# Patient Record
Sex: Male | Born: 1970 | Race: Black or African American | Hispanic: No | Marital: Single | Smoking: Former smoker
Health system: Southern US, Community
[De-identification: ages and names within clinical notes are randomized; demographics above are authoritative.]

## PROBLEM LIST (undated history)

## (undated) DIAGNOSIS — E119 Type 2 diabetes mellitus without complications: Secondary | ICD-10-CM

## (undated) DIAGNOSIS — I10 Essential (primary) hypertension: Secondary | ICD-10-CM

---

## 2009-08-14 ENCOUNTER — Emergency Department (HOSPITAL_BASED_OUTPATIENT_CLINIC_OR_DEPARTMENT_OTHER): Admission: EM | Admit: 2009-08-14 | Discharge: 2009-08-14 | Payer: Self-pay | Admitting: Emergency Medicine

## 2009-08-14 ENCOUNTER — Ambulatory Visit: Payer: Self-pay | Admitting: Diagnostic Radiology

## 2010-03-28 ENCOUNTER — Ambulatory Visit: Payer: Self-pay | Admitting: Diagnostic Radiology

## 2010-03-28 ENCOUNTER — Emergency Department (HOSPITAL_BASED_OUTPATIENT_CLINIC_OR_DEPARTMENT_OTHER): Admission: EM | Admit: 2010-03-28 | Discharge: 2010-03-28 | Payer: Self-pay | Admitting: Emergency Medicine

## 2010-09-07 ENCOUNTER — Emergency Department (HOSPITAL_BASED_OUTPATIENT_CLINIC_OR_DEPARTMENT_OTHER)
Admission: EM | Admit: 2010-09-07 | Discharge: 2010-09-07 | Payer: Self-pay | Source: Home / Self Care | Admitting: Emergency Medicine

## 2010-12-04 LAB — CBC
Hemoglobin: 15.1 g/dL (ref 13.0–17.0)
MCH: 23.7 pg — ABNORMAL LOW (ref 26.0–34.0)
RDW: 15.1 % (ref 11.5–15.5)

## 2010-12-04 LAB — COMPREHENSIVE METABOLIC PANEL
AST: 27 U/L (ref 0–37)
Albumin: 4.2 g/dL (ref 3.5–5.2)
Alkaline Phosphatase: 62 U/L (ref 39–117)
CO2: 27 mEq/L (ref 19–32)
Calcium: 9.2 mg/dL (ref 8.4–10.5)
Creatinine, Ser: 0.9 mg/dL (ref 0.4–1.5)
GFR calc Af Amer: >60 mL/min (ref >60)
Glucose, Bld: 110 mg/dL — ABNORMAL HIGH (ref 70–99)
Potassium: 3.7 mEq/L (ref 3.5–5.1)
Total Protein: 7.4 g/dL (ref 6.0–8.3)

## 2010-12-04 LAB — URINALYSIS, ROUTINE W REFLEX MICROSCOPIC
Bilirubin Urine: NEGATIVE
Ketones, ur: NEGATIVE mg/dL
Nitrite: NEGATIVE
Protein, ur: NEGATIVE mg/dL
Urobilinogen, UA: 1 mg/dL (ref 0.0–1.0)
pH: 6 (ref 5.0–8.0)

## 2010-12-04 LAB — DIFFERENTIAL
Basophils Relative: 2 % — ABNORMAL HIGH (ref 0–1)
Eosinophils Absolute: 0.2 10*3/uL (ref 0.0–0.7)
Lymphocytes Relative: 38 % (ref 12–46)
Lymphs Abs: 3.3 10*3/uL (ref 0.7–4.0)
Monocytes Relative: 7 % (ref 3–12)
Neutro Abs: 4.6 10*3/uL (ref 1.7–7.7)
Neutrophils Relative %: 51 % (ref 43–77)

## 2010-12-04 LAB — LIPASE, BLOOD: Lipase: 100 U/L (ref 23–300)

## 2010-12-21 LAB — COMPREHENSIVE METABOLIC PANEL
ALT: 39 U/L (ref 0–53)
AST: 38 U/L — ABNORMAL HIGH (ref 0–37)
Alkaline Phosphatase: 57 U/L (ref 39–117)
BUN: 15 mg/dL (ref 6–23)
Chloride: 104 mEq/L (ref 96–112)
Glucose, Bld: 76 mg/dL (ref 70–99)

## 2010-12-21 LAB — CBC
HCT: 48.7 % (ref 39.0–52.0)
Hemoglobin: 15.4 g/dL (ref 13.0–17.0)
RBC: 6.43 MIL/uL — ABNORMAL HIGH (ref 4.22–5.81)
RDW: 14.6 % (ref 11.5–15.5)
WBC: 10.1 10*3/uL (ref 4.0–10.5)

## 2010-12-21 LAB — URINALYSIS, ROUTINE W REFLEX MICROSCOPIC
Bilirubin Urine: NEGATIVE
Glucose, UA: NEGATIVE mg/dL
Hgb urine dipstick: NEGATIVE
Ketones, ur: NEGATIVE mg/dL
Nitrite: NEGATIVE
Protein, ur: NEGATIVE mg/dL
Specific Gravity, Urine: 1.021 (ref 1.005–1.030)
Urobilinogen, UA: 0.2 mg/dL (ref 0.0–1.0)
pH: 6.5 (ref 5.0–8.0)

## 2010-12-21 LAB — DIFFERENTIAL
Basophils Absolute: 0.3 10*3/uL — ABNORMAL HIGH (ref 0.0–0.1)
Basophils Relative: 3 % — ABNORMAL HIGH (ref 0–1)
Eosinophils Absolute: 0.1 10*3/uL (ref 0.0–0.7)
Eosinophils Relative: 1 % (ref 0–5)
Lymphocytes Relative: 35 % (ref 12–46)
Lymphs Abs: 3.5 10*3/uL (ref 0.7–4.0)
Monocytes Absolute: 0.6 10*3/uL (ref 0.1–1.0)
Monocytes Relative: 6 % (ref 3–12)
Neutro Abs: 5.6 10*3/uL (ref 1.7–7.7)
Neutrophils Relative %: 54 % (ref 43–77)

## 2011-03-02 ENCOUNTER — Emergency Department (HOSPITAL_BASED_OUTPATIENT_CLINIC_OR_DEPARTMENT_OTHER)
Admission: EM | Admit: 2011-03-02 | Discharge: 2011-03-02 | Disposition: A | Discharge status: Home / Self Care | Payer: Self-pay | Attending: Emergency Medicine | Admitting: Emergency Medicine

## 2011-03-02 ENCOUNTER — Emergency Department (INDEPENDENT_AMBULATORY_CARE_PROVIDER_SITE_OTHER): Payer: Self-pay

## 2011-03-02 DIAGNOSIS — M25539 Pain in unspecified wrist: Secondary | ICD-10-CM

## 2011-03-02 DIAGNOSIS — S60219A Contusion of unspecified wrist, initial encounter: Secondary | ICD-10-CM | POA: Insufficient documentation

## 2011-03-02 DIAGNOSIS — S7000XA Contusion of unspecified hip, initial encounter: Secondary | ICD-10-CM | POA: Insufficient documentation

## 2011-03-02 DIAGNOSIS — M25559 Pain in unspecified hip: Secondary | ICD-10-CM

## 2011-03-02 DIAGNOSIS — M542 Cervicalgia: Secondary | ICD-10-CM

## 2011-10-25 ENCOUNTER — Emergency Department (HOSPITAL_BASED_OUTPATIENT_CLINIC_OR_DEPARTMENT_OTHER)
Admission: EM | Admit: 2011-10-25 | Discharge: 2011-10-25 | Disposition: A | Discharge status: Home / Self Care | Payer: BC Managed Care – PPO | Attending: Emergency Medicine | Admitting: Emergency Medicine

## 2011-10-25 ENCOUNTER — Encounter (HOSPITAL_BASED_OUTPATIENT_CLINIC_OR_DEPARTMENT_OTHER): Payer: Self-pay | Admitting: Emergency Medicine

## 2011-10-25 DIAGNOSIS — S058X9A Other injuries of unspecified eye and orbit, initial encounter: Secondary | ICD-10-CM

## 2011-10-25 DIAGNOSIS — J329 Chronic sinusitis, unspecified: Secondary | ICD-10-CM

## 2011-10-25 DIAGNOSIS — Y9289 Other specified places as the place of occurrence of the external cause: Secondary | ICD-10-CM | POA: Insufficient documentation

## 2011-10-25 DIAGNOSIS — T1590XA Foreign body on external eye, part unspecified, unspecified eye, initial encounter: Secondary | ICD-10-CM | POA: Insufficient documentation

## 2011-10-25 DIAGNOSIS — F17200 Nicotine dependence, unspecified, uncomplicated: Secondary | ICD-10-CM | POA: Insufficient documentation

## 2011-10-25 MED ORDER — CIPROFLOXACIN HCL 0.3 % OP SOLN
2.0000 [drp] | OPHTHALMIC | Status: DC
  Administered 2011-10-25: 09:00:00 via OPHTHALMIC

## 2011-10-25 MED ORDER — TETANUS-DIPHTH-ACELL PERTUSSIS 5-2.5-18.5 LF-MCG/0.5 IM SUSP
0.5000 mL | Freq: Once | INTRAMUSCULAR | Status: AC
  Administered 2011-10-25: 0.5 mL via INTRAMUSCULAR
  Filled 2011-10-25: qty 0.5

## 2011-10-25 MED ORDER — AMOXICILLIN 500 MG PO CAPS: 500.0000 mg | ORAL_CAPSULE | Freq: Three times a day (TID) | ORAL | Status: AC

## 2011-10-25 MED ORDER — TETRACAINE HCL 0.5 % OP SOLN
1.0000 [drp] | Freq: Once | OPHTHALMIC | Status: AC
  Administered 2011-10-25: 1 [drp] via OPHTHALMIC
  Filled 2011-10-25: qty 2

## 2011-10-25 MED ORDER — FLUORESCEIN SODIUM 1 MG OP STRP
1.0000 | ORAL_STRIP | Freq: Once | OPHTHALMIC | Status: AC
  Administered 2011-10-25: 1 via OPHTHALMIC
  Filled 2011-10-25: qty 1

## 2011-10-25 MED ORDER — CIPROFLOXACIN HCL 0.3 % OP SOLN
OPHTHALMIC | Status: AC
  Filled 2011-10-25: qty 2.5

## 2011-10-25 NOTE — ED Provider Notes (Signed)
History     CSN: [id number]  Arrival date & time 10/25/11  9562   First MD Initiated Contact with Patient 10/25/11 [phone]      Chief Complaint  Patient presents with  . Eye Pain   patient noticed right eye redness after loading some boxes into a truck on Monday. He states it has been aching read with some watery drainage. He did take his contact lens out of the right eye, but left in his left eye. His vision is unchanged. He's also had a headache and some sinus drainage. He's had no fevers. No significant redness around the eye. He denies any vomiting. Denies any direct trauma. His tetanus is approximately 7 years out of date  (Consider location/radiation/quality/duration/timing/severity/associated sxs/prior treatment) HPI  History reviewed. No pertinent past medical history.  History reviewed. No pertinent past surgical history.  No family history on file.  History  Substance Use Topics  . Smoking status: Current Everyday Smoker  . Smokeless tobacco: Not on file  . Alcohol Use: Yes      Review of Systems  All other systems reviewed and are negative.    Allergies  Review of patient's allergies indicates no known allergies.  Home Medications  No current outpatient prescriptions on file.  BP 140/86  Pulse 65  Temp(Src) 97.9 F (36.6 C) (Oral)  Resp 18  SpO2 100%  Physical Exam  Nursing note and vitals reviewed. Constitutional: He is oriented to person, place, and time. He appears well-developed and well-nourished.  HENT:  Head: Normocephalic and atraumatic.  Nose: Nose normal.  Mouth/Throat: Oropharynx is clear and moist.       Nasal congestion, and right maxillary sinus tenderness  Eyes: Conjunctivae and EOM are normal. Pupils are equal, round, and reactive to light. Right eye exhibits no discharge. Left eye exhibits no discharge.  Neck: Neck supple. No JVD present.  Cardiovascular: Normal rate and regular rhythm.  Exam reveals no gallop and no friction rub.     No murmur heard. Pulmonary/Chest: Breath sounds normal. He has no wheezes. He has no rales. He exhibits no tenderness.  Abdominal: Soft. Bowel sounds are normal. He exhibits no distension. There is no tenderness. There is no rebound and no guarding.  Musculoskeletal: Normal range of motion.  Lymphadenopathy:    He has no cervical adenopathy.  Neurological: He is alert and oriented to person, place, and time. No cranial nerve deficit. Coordination normal.  Skin: Skin is warm and dry. No rash noted.  Psychiatric: He has a normal mood and affect.    ED Course  Procedures (including critical care time)  Labs Reviewed - No data to display No results found.   No diagnosis found.    MDM  Pt is seen and examined;  Initial history and physical completed.  Will follow.          Bernyce Brimley A. Patrica Duel, MD 10/26/11 1452

## 2011-10-25 NOTE — ED Notes (Signed)
Pt c/o right eye pain and redness. Pt reports he felt like he might have gotten dust in it on mon.

## 2012-09-13 ENCOUNTER — Encounter (HOSPITAL_BASED_OUTPATIENT_CLINIC_OR_DEPARTMENT_OTHER): Payer: Self-pay | Admitting: *Deleted

## 2012-09-13 ENCOUNTER — Emergency Department (HOSPITAL_BASED_OUTPATIENT_CLINIC_OR_DEPARTMENT_OTHER)
Admission: EM | Admit: 2012-09-13 | Discharge: 2012-09-13 | Disposition: A | Discharge status: Home / Self Care | Payer: BC Managed Care – PPO | Attending: Emergency Medicine | Admitting: Emergency Medicine

## 2012-09-13 DIAGNOSIS — T23239A Burn of second degree of unspecified multiple fingers (nail), not including thumb, initial encounter: Secondary | ICD-10-CM | POA: Insufficient documentation

## 2012-09-13 DIAGNOSIS — Y9389 Activity, other specified: Secondary | ICD-10-CM | POA: Insufficient documentation

## 2012-09-13 DIAGNOSIS — X131XXA Other contact with steam and other hot vapors, initial encounter: Secondary | ICD-10-CM | POA: Insufficient documentation

## 2012-09-13 DIAGNOSIS — X12XXXA Contact with other hot fluids, initial encounter: Secondary | ICD-10-CM | POA: Insufficient documentation

## 2012-09-13 DIAGNOSIS — T304 Corrosion of unspecified body region, unspecified degree: Secondary | ICD-10-CM

## 2012-09-13 DIAGNOSIS — Y92009 Unspecified place in unspecified non-institutional (private) residence as the place of occurrence of the external cause: Secondary | ICD-10-CM | POA: Insufficient documentation

## 2012-09-13 DIAGNOSIS — F17200 Nicotine dependence, unspecified, uncomplicated: Secondary | ICD-10-CM | POA: Insufficient documentation

## 2012-09-13 MED ORDER — IBUPROFEN 800 MG PO TABS
800.0000 mg | ORAL_TABLET | Freq: Once | ORAL | Status: AC
  Administered 2012-09-13: 800 mg via ORAL
  Filled 2012-09-13: qty 1
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
## 2012-09-13 NOTE — ED Notes (Signed)
[facility] for [facility]--waiting for doctor on call to come to telephone.
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
## 2013-09-07 ENCOUNTER — Emergency Department (HOSPITAL_BASED_OUTPATIENT_CLINIC_OR_DEPARTMENT_OTHER)
Admission: EM | Admit: 2013-09-07 | Discharge: 2013-09-07 | Disposition: A | Discharge status: Home / Self Care | Payer: 59 | Attending: Emergency Medicine | Admitting: Emergency Medicine

## 2013-09-07 ENCOUNTER — Encounter (HOSPITAL_BASED_OUTPATIENT_CLINIC_OR_DEPARTMENT_OTHER): Payer: Self-pay | Admitting: Emergency Medicine

## 2013-09-07 DIAGNOSIS — F17200 Nicotine dependence, unspecified, uncomplicated: Secondary | ICD-10-CM | POA: Insufficient documentation

## 2013-09-07 DIAGNOSIS — R220 Localized swelling, mass and lump, head: Secondary | ICD-10-CM | POA: Insufficient documentation

## 2013-09-07 DIAGNOSIS — L259 Unspecified contact dermatitis, unspecified cause: Secondary | ICD-10-CM

## 2013-09-07 MED ORDER — DEXAMETHASONE SODIUM PHOSPHATE 10 MG/ML IJ SOLN
10.0000 mg | Freq: Once | INTRAMUSCULAR | Status: AC
  Administered 2013-09-07: 10 mg via INTRAMUSCULAR
  Filled 2013-09-07: qty 1

## 2013-09-07 MED ORDER — PREDNISONE 10 MG PO TABS: 20.0000 mg | ORAL_TABLET | Freq: Every day | ORAL | Status: DC

## 2013-09-07 MED ORDER — HYDROXYZINE HCL 25 MG PO TABS: 25.0000 mg | ORAL_TABLET | Freq: Four times a day (QID) | ORAL | Status: DC

## 2013-09-07 NOTE — ED Provider Notes (Signed)
CSN: [id number]     Arrival date & time 09/07/13  0935 History   First MD Initiated Contact with Patient 09/07/13 [phone]     Chief Complaint  Patient presents with  . Rash   (Consider location/radiation/quality/duration/timing/severity/associated sxs/prior Treatment) HPI Comments: Patient presents with a two-day history of a rash. He states that 2 days ago used a hair dye to his beard. Shortly after that he started itching and developed bumps along his beard and his upper chest. He's had a history of an allergic reaction to hair dye in the past that was similar to this. He has a little swelling around his beard but denies any other facial swelling. There's no swelling to his tongue or lips. He denies any wheezing or shortness of breath. He's not been using any over-the-counter medications.  Patient is a 42 y.o. male presenting with rash.  Rash Associated symptoms: no abdominal pain, no diarrhea, no fatigue, no fever, no headaches, no joint pain, no nausea, no shortness of breath and not vomiting     History reviewed. No pertinent past medical history. History reviewed. No pertinent past surgical history. No family history on file. History  Substance Use Topics  . Smoking status: Current Every Day Smoker  . Smokeless tobacco: Not on file  . Alcohol Use: Yes    Review of Systems  Constitutional: Negative for fever, chills, diaphoresis and fatigue.  HENT: Positive for facial swelling. Negative for congestion, rhinorrhea and sneezing.   Eyes: Negative.   Respiratory: Negative for cough, chest tightness and shortness of breath.   Cardiovascular: Negative for chest pain and leg swelling.  Gastrointestinal: Negative for nausea, vomiting, abdominal pain, diarrhea and blood in stool.  Genitourinary: Negative for frequency, hematuria, flank pain and difficulty urinating.  Musculoskeletal: Negative for arthralgias and back pain.  Skin: Positive for rash.  Neurological: Negative for dizziness,  speech difficulty, weakness, numbness and headaches.    Allergies  Review of patient's allergies indicates no known allergies.  Home Medications   Current Outpatient Rx  Name  Route  Sig  Dispense  Refill  . hydrOXYzine (ATARAX/VISTARIL) 25 MG tablet   Oral   Take 1 tablet (25 mg total) by mouth every 6 (six) hours.   12 tablet   0   . predniSONE (DELTASONE) 10 MG tablet   Oral   Take 2 tablets (20 mg total) by mouth daily.   10 tablet   0    BP 149/99  Pulse 67  Temp(Src) 99 F (37.2 C) (Oral)  Resp 18  SpO2 98% Physical Exam  Constitutional: He is oriented to person, place, and time. He appears well-developed and well-nourished.  HENT:  Head: Normocephalic and atraumatic.  Eyes: Pupils are equal, round, and reactive to light.  Neck: Normal range of motion. Neck supple.  Cardiovascular: Normal rate, regular rhythm and normal heart sounds.   Pulmonary/Chest: Effort normal and breath sounds normal. No respiratory distress. He has no wheezes. He has no rales. He exhibits no tenderness.  Abdominal: Soft. Bowel sounds are normal. There is no tenderness. There is no rebound and no guarding.  Musculoskeletal: Normal range of motion. He exhibits no edema.  Lymphadenopathy:    He has no cervical adenopathy.  Neurological: He is alert and oriented to person, place, and time.  Skin: Skin is warm and dry. Rash noted.  Patient has some erythema and some mild swelling around his chin and cheeks to his beard area. He has small red papules to this area and  along his upper chest.  Psychiatric: He has a normal mood and affect.    ED Course  Procedures (including critical care time) Labs Review Labs Reviewed - No data to display Imaging Review No results found.  EKG Interpretation   None       MDM   1. Contact dermatitis    There is no evidence of cellulitis. He was given a shot of Decadron. He was given a prescription for prednisone. He was also given a prescription for  Atarax to use for itching. He was advised to return if his symptoms worsen or are not improving.    Rolan Bucco, MD 09/07/13 1012

## 2013-09-07 NOTE — ED Notes (Signed)
Patient here with rash to beard area and upper chest after using dye on beard 2 days ago, itching to same .

## 2021-05-27 ENCOUNTER — Other Ambulatory Visit: Payer: Self-pay

## 2021-05-27 ENCOUNTER — Encounter (HOSPITAL_BASED_OUTPATIENT_CLINIC_OR_DEPARTMENT_OTHER): Payer: Self-pay

## 2021-05-27 ENCOUNTER — Emergency Department (HOSPITAL_BASED_OUTPATIENT_CLINIC_OR_DEPARTMENT_OTHER)
Admission: EM | Admit: 2021-05-27 | Discharge: 2021-05-27 | Disposition: A | Discharge status: Home / Self Care | Payer: 59 | Attending: Emergency Medicine | Admitting: Emergency Medicine

## 2021-05-27 DIAGNOSIS — Z23 Encounter for immunization: Secondary | ICD-10-CM | POA: Insufficient documentation

## 2021-05-27 DIAGNOSIS — W268XXA Contact with other sharp object(s), not elsewhere classified, initial encounter: Secondary | ICD-10-CM | POA: Diagnosis not present

## 2021-05-27 DIAGNOSIS — Z87891 Personal history of nicotine dependence: Secondary | ICD-10-CM | POA: Diagnosis not present

## 2021-05-27 DIAGNOSIS — E119 Type 2 diabetes mellitus without complications: Secondary | ICD-10-CM | POA: Diagnosis not present

## 2021-05-27 DIAGNOSIS — S6992XA Unspecified injury of left wrist, hand and finger(s), initial encounter: Secondary | ICD-10-CM | POA: Diagnosis present

## 2021-05-27 DIAGNOSIS — S61012A Laceration without foreign body of left thumb without damage to nail, initial encounter: Secondary | ICD-10-CM | POA: Diagnosis not present

## 2021-05-27 HISTORY — DX: Type 2 diabetes mellitus without complications: E11.9

## 2021-05-27 MED ORDER — BACITRACIN ZINC 500 UNIT/GM EX OINT: TOPICAL_OINTMENT | Freq: Two times a day (BID) | CUTANEOUS | Status: DC

## 2021-05-27 MED ORDER — LIDOCAINE HCL (PF) 1 % IJ SOLN
10.0000 mL | Freq: Once | INTRAMUSCULAR | Status: AC
  Administered 2021-05-27: 10 mL
  Filled 2021-05-27: qty 10

## 2021-05-27 MED ORDER — TETANUS-DIPHTH-ACELL PERTUSSIS 5-2.5-18.5 LF-MCG/0.5 IM SUSY
0.5000 mL | PREFILLED_SYRINGE | Freq: Once | INTRAMUSCULAR | Status: AC
  Administered 2021-05-27: 0.5 mL via INTRAMUSCULAR
  Filled 2021-05-27: qty 0.5

## 2021-05-27 NOTE — ED Provider Notes (Signed)
[facility] EMERGENCY DEPARTMENT Provider Note   CSN: [id number] Arrival date & time: 05/27/21  1517     History Chief Complaint  Patient presents with   Hand Injury    Tanner Duncan is a 50 y.o. male with past medical history significant for diabetes who presents after cutting his left lateral thumb with a razor blade.  Patient reports that he was breaking down boxes, and just replaced blades that it was very sharp, and he accidentally sliced the side of his thumb.  Patient does not know when his last tetanus shot was.   Hand Injury     Past Medical History:  Diagnosis Date   Diabetes mellitus without complication (HCC)     There are no problems to display for this patient.   History reviewed. No pertinent surgical history.     No family history on file.  Social History   Tobacco Use   Smoking status: Former    Types: Cigarettes   Smokeless tobacco: Never  Vaping Use   Vaping Use: Former  Substance Use Topics   Alcohol use: Yes    Comment: occ   Drug use: Never    Home Medications Prior to Admission medications   Medication Sig Start Date End Date Taking? Authorizing Provider  hydrOXYzine (ATARAX/VISTARIL) 25 MG tablet Take 1 tablet (25 mg total) by mouth every 6 (six) hours. 09/07/13   Rolan Bucco, MD  predniSONE (DELTASONE) 10 MG tablet Take 2 tablets (20 mg total) by mouth daily. 09/07/13   Rolan Bucco, MD    Allergies    Patient has no known allergies.  Review of Systems   Review of Systems  Skin:  Positive for wound.  All other systems reviewed and are negative.  Physical Exam Updated Vital Signs BP (!) 137/93 (BP Location: Right Arm)   Pulse 71   Temp 98.7 F (37.1 C) (Oral)   Resp 18   Ht 5\' 8"  (1.727 m)   Wt 96.2 kg   SpO2 100%   BMI 32.23 kg/m   Physical Exam Vitals and nursing note reviewed.  Constitutional:      General: He is not in acute distress.    Appearance: Normal appearance.  HENT:     Head:  Normocephalic and atraumatic.  Eyes:     General:        Right eye: No discharge.        Left eye: No discharge.  Cardiovascular:     Rate and Rhythm: Normal rate and regular rhythm.  Pulmonary:     Effort: Pulmonary effort is normal. No respiratory distress.  Musculoskeletal:        General: No deformity.     Comments: Strength of entire left first digit 5 out of 5, with extension and flexion.  Skin:    General: Skin is warm and dry.     Capillary Refill: Capillary refill takes less than 2 seconds.     Comments: Approximately 5 cm linear laceration on the lateral surface of the left thumb.  Entire length and depth of wound visualized, no contamination, no retained foreign body  Neurological:     Mental Status: He is alert and oriented to person, place, and time.     Comments: No loss of sensation, numbness, tingling across entirety of left thumb.  Psychiatric:        Mood and Affect: Mood normal.        Behavior: Behavior normal.    ED Results / Procedures /  Treatments   Labs (all labs ordered are listed, but only abnormal results are displayed) Labs Reviewed - No data to display  EKG None  Radiology No results found.  Procedures .Marland KitchenLaceration Repair  Date/Time: 05/27/2021 5:22 PM Performed by: Olene Floss, PA-C Authorized by: Olene Floss, PA-C   Consent:    Consent obtained:  Verbal   Consent given by:  Patient   Risks, benefits, and alternatives were discussed: yes     Risks discussed:  Infection and poor wound healing Universal protocol:    Procedure explained and questions answered to patient or proxy's satisfaction: yes     Patient identity confirmed:  Verbally with patient Anesthesia:    Anesthesia method:  Local infiltration   Local anesthetic:  Lidocaine 1% w/o epi Laceration details:    Location:  Finger   Finger location:  L thumb   Length (cm):  5   Depth (mm):  3 Exploration:    Contaminated: no   Treatment:    Area cleansed  with:  Shur-Clens   Amount of cleaning:  Standard   Irrigation solution:  Sterile saline Skin repair:    Repair method:  Sutures   Suture size:  4-0   Suture material:  Prolene   Number of sutures:  8 Approximation:    Approximation:  Close Repair type:    Repair type:  Simple Post-procedure details:    Dressing:  Non-adherent dressing   Procedure completion:  Tolerated   Medications Ordered in ED Medications  Tdap (BOOSTRIX) injection 0.5 mL (has no administration in time range)  bacitracin ointment (has no administration in time range)  lidocaine (PF) (XYLOCAINE) 1 % injection 10 mL (10 mLs Infiltration Given 05/27/21 1702)    ED Course  I have reviewed the triage vital signs and the nursing notes.  Pertinent labs & imaging results that were available during my care of the patient were reviewed by me and considered in my medical decision making (see chart for details).    MDM Rules/Calculators/A&P                         Entire digit with 5 out of 5 strength and neurovascularly intact.  Clean linear laceration.  Repaired as described above.  Bleeding controlled at time of discharge.  Patient given signs of infection to look out for, return precautions.  Wound bandaged prior to discharge. Final Clinical Impression(s) / ED Diagnoses Final diagnoses:  Laceration of left thumb without foreign body without damage to nail, initial encounter    Rx / DC Orders ED Discharge Orders     None        Olene Floss, PA-C 05/27/21 1726    Rolan Bucco, MD 05/27/21 2315

## 2021-05-27 NOTE — ED Triage Notes (Signed)
Pt states he cut left hand on razor blade cutting cardboard-lac noted to entire lateral thumb-no bleeding-gauze/kling wrap applied-NAD-steady gait

## 2021-05-27 NOTE — Discharge Instructions (Signed)
Please return to primary care, urgent care, or the emergency department in 10-14 for suture removal.  Apply an antibacterial ointment such as Polysporin to the wound and bandage for the next few days.  Please watch out for signs of infection including excessive pain, heat, swelling, purulent drainage.

## 2022-02-11 ENCOUNTER — Emergency Department (HOSPITAL_BASED_OUTPATIENT_CLINIC_OR_DEPARTMENT_OTHER)
Admission: EM | Admit: 2022-02-11 | Discharge: 2022-02-11 | Disposition: A | Discharge status: Home / Self Care | Payer: 59 | Attending: Emergency Medicine | Admitting: Emergency Medicine

## 2022-02-11 ENCOUNTER — Encounter (HOSPITAL_BASED_OUTPATIENT_CLINIC_OR_DEPARTMENT_OTHER): Payer: Self-pay | Admitting: Emergency Medicine

## 2022-02-11 ENCOUNTER — Other Ambulatory Visit: Payer: Self-pay

## 2022-02-11 DIAGNOSIS — I10 Essential (primary) hypertension: Secondary | ICD-10-CM | POA: Insufficient documentation

## 2022-02-11 DIAGNOSIS — E860 Dehydration: Secondary | ICD-10-CM | POA: Insufficient documentation

## 2022-02-11 DIAGNOSIS — Z87891 Personal history of nicotine dependence: Secondary | ICD-10-CM | POA: Insufficient documentation

## 2022-02-11 DIAGNOSIS — R55 Syncope and collapse: Secondary | ICD-10-CM | POA: Diagnosis present

## 2022-02-11 DIAGNOSIS — R0989 Other specified symptoms and signs involving the circulatory and respiratory systems: Secondary | ICD-10-CM | POA: Diagnosis not present

## 2022-02-11 DIAGNOSIS — E119 Type 2 diabetes mellitus without complications: Secondary | ICD-10-CM | POA: Insufficient documentation

## 2022-02-11 HISTORY — DX: Essential (primary) hypertension: I10

## 2022-02-11 LAB — CBC WITH DIFFERENTIAL/PLATELET
Abs Immature Granulocytes: 0.04 10*3/uL (ref 0.00–0.07)
Basophils Absolute: 0 10*3/uL (ref 0.0–0.1)
Basophils Relative: 0 %
Eosinophils Absolute: 0.1 10*3/uL (ref 0.0–0.5)
Eosinophils Relative: 2 %
HCT: 43.9 % (ref 39.0–52.0)
Hemoglobin: 14.6 g/dL (ref 13.0–17.0)
Immature Granulocytes: 1 %
Lymphocytes Relative: 40 %
Lymphs Abs: 2.9 10*3/uL (ref 0.7–4.0)
MCH: 23.7 pg — ABNORMAL LOW (ref 26.0–34.0)
MCHC: 33.3 g/dL (ref 30.0–36.0)
MCV: 71.2 fL — ABNORMAL LOW (ref 80.0–100.0)
Monocytes Absolute: 0.4 10*3/uL (ref 0.1–1.0)
Monocytes Relative: 5 %
Neutro Abs: 3.7 10*3/uL (ref 1.7–7.7)
Neutrophils Relative %: 52 %
Platelets: 346 10*3/uL (ref 150–400)
RBC: 6.17 MIL/uL — ABNORMAL HIGH (ref 4.22–5.81)
RDW: 16.6 % — ABNORMAL HIGH (ref 11.5–15.5)
WBC: 7.1 10*3/uL (ref 4.0–10.5)
nRBC: 0 % (ref 0.0–0.2)

## 2022-02-11 LAB — BASIC METABOLIC PANEL
Anion gap: 7 (ref 5–15)
BUN: 24 mg/dL — ABNORMAL HIGH (ref 6–20)
CO2: 25 mmol/L (ref 22–32)
Calcium: 9.5 mg/dL (ref 8.9–10.3)
Chloride: 104 mmol/L (ref 98–111)
Creatinine, Ser: 1.03 mg/dL (ref 0.61–1.24)
GFR, Estimated: >60 mL/min (ref >60)
Glucose, Bld: 146 mg/dL — ABNORMAL HIGH (ref 70–99)
Potassium: 3.3 mmol/L — ABNORMAL LOW (ref 3.5–5.1)
Sodium: 136 mmol/L (ref 135–145)

## 2022-02-11 LAB — URINALYSIS, ROUTINE W REFLEX MICROSCOPIC
Bilirubin Urine: NEGATIVE
Glucose, UA: NEGATIVE mg/dL
Hgb urine dipstick: NEGATIVE
Ketones, ur: NEGATIVE mg/dL
Leukocytes,Ua: NEGATIVE
Nitrite: NEGATIVE
Protein, ur: 30 mg/dL — AB
Specific Gravity, Urine: >=1.03 (ref 1.005–1.030)
pH: 5.5 (ref 5.0–8.0)

## 2022-02-11 LAB — URINALYSIS, MICROSCOPIC (REFLEX)
RBC / HPF: NONE SEEN RBC/hpf (ref 0–5)
WBC, UA: NONE SEEN WBC/hpf (ref 0–5)

## 2022-02-11 LAB — TROPONIN I (HIGH SENSITIVITY)
Troponin I (High Sensitivity): 4 ng/L (ref <18)
Troponin I (High Sensitivity): 4 ng/L (ref <18)

## 2022-02-11 LAB — CBG MONITORING, ED: Glucose-Capillary: 174 mg/dL — ABNORMAL HIGH (ref 70–99)

## 2022-02-11 MED ORDER — SODIUM CHLORIDE 0.9 % IV BOLUS
1000.0000 mL | Freq: Once | INTRAVENOUS | Status: AC
  Administered 2022-02-11: 1000 mL via INTRAVENOUS

## 2022-02-11 NOTE — ED Provider Notes (Signed)
[facility] Provider Note: [name], MD, FACEP  CSN: [id number] MRN: [id number] ARRIVAL: 02/11/22 at 0111 ROOM: [facility]10/[facility]10   CHIEF COMPLAINT  Syncope   HISTORY OF PRESENT ILLNESS  02/11/22 1:27 AM Tanner Duncan is a 51 y.o. male who got up yesterday morning about 4:30 AM.  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  He then came home and mow the lawn.  He drank 2 bottles of water during this time.  About an hour prior to arrival he was in the kitchen cutting ribs when he began to feel weak and lightheaded.  He asked for a chair and sat down.  He had a brief syncopal episode (less than 1 minute) during which his eyes were open but he was unresponsive.  He had no tonic-clonic activity.  He did not fall.  When he regained consciousness he was diaphoretic.  He got up and walked around, went outside briefly then came back again and sat down.  He then had a second similar episode.  EMS was summoned and he was found to have a sugar of 93.  His initial blood pressure was low but on arrival here it is 103/60.  He never had chest pain or shortness of breath.  He is denying symptoms now.  Past Medical History:  Diagnosis Date   Diabetes mellitus without complication (HCC)    Hypertension     History reviewed. No pertinent surgical history.  History reviewed. No pertinent family history.  Social History   Tobacco Use   Smoking status: Former    Types: Cigarettes   Smokeless tobacco: Never  Vaping Use   Vaping Use: Former  Substance Use Topics   Alcohol use: Yes    Comment: occ   Drug use: Never    Prior to Admission medications   Not on File    Allergies Patient has no known allergies.   REVIEW OF SYSTEMS  Negative except as noted here or in the History of Present Illness.   PHYSICAL EXAMINATION  Initial Vital Signs Blood pressure 103/60, pulse 75, temperature 98.1 F (36.7 C), temperature source Oral, resp.  rate 18, height 5\' 8"  (1.727 m), weight 95.3 kg, SpO2 99 %.  Examination General: Well-developed, well-nourished male in no acute distress; appearance consistent with age of record HENT: normocephalic; atraumatic Eyes: Normal appearance Neck: supple Heart: regular rate and rhythm; no murmur Lungs: clear to auscultation bilaterally Abdomen: soft; nondistended; nontender; no masses or hepatosplenomegaly; bowel sounds present Extremities: No deformity; full range of motion; pulses normal Neurologic: Awake, alert and oriented; motor function intact in all extremities and symmetric; no facial droop Skin: Warm and dry Psychiatric: Flat affect   RESULTS  Summary of this visit's results, reviewed and interpreted by myself:   EKG Interpretation  Date/Time:  Saturday Feb 11 2022 01:33:39 EDT Ventricular Rate:  65 PR Interval:  136 QRS Duration: 92 QT Interval:  413 QTC Calculation: 430 R Axis:   35 Text Interpretation: Sinus rhythm Repol abnrm suggests ischemia, inferior and lateral leads No previous ECGs available Confirmed by Tanner Duncan, 06-16-1984 (Tanner Duncan) on 02/11/2022 1:37:41 AM       Laboratory Studies: Results for orders placed or performed during the hospital encounter of 02/11/22 (from the past 24 hour(s))  Urinalysis, Routine w reflex microscopic Urine, Clean Catch     Status: Abnormal   Collection Time: 02/11/22  1:28 AM  Result Value Ref Range   Color, Urine YELLOW  YELLOW   APPearance HAZY (A) CLEAR   Specific Gravity, Urine >=1.030 1.005 - 1.030   pH 5.5 5.0 - 8.0   Glucose, UA NEGATIVE NEGATIVE mg/dL   Hgb urine dipstick NEGATIVE NEGATIVE   Bilirubin Urine NEGATIVE NEGATIVE   Ketones, ur NEGATIVE NEGATIVE mg/dL   Protein, ur 30 (A) NEGATIVE mg/dL   Nitrite NEGATIVE NEGATIVE   Leukocytes,Ua NEGATIVE NEGATIVE  Urinalysis, Microscopic (reflex)     Status: Abnormal   Collection Time: 02/11/22  1:28 AM  Result Value Ref Range   RBC / HPF NONE SEEN 0 - 5 RBC/hpf   WBC, UA NONE  SEEN 0 - 5 WBC/hpf   Bacteria, UA RARE (A) NONE SEEN   Squamous Epithelial / LPF 0-5 0 - 5  CBG monitoring, ED     Status: Abnormal   Collection Time: 02/11/22  1:36 AM  Result Value Ref Range   Glucose-Capillary 174 (H) 70 - 99 mg/dL  CBC with Differential     Status: Abnormal   Collection Time: 02/11/22  1:40 AM  Result Value Ref Range   WBC 7.1 4.0 - 10.5 K/uL   RBC 6.17 (H) 4.22 - 5.81 MIL/uL   Hemoglobin 14.6 13.0 - 17.0 g/dL   HCT 82.943.9 56.239.0 - 13.052.0 %   MCV 71.2 (L) 80.0 - 100.0 fL   MCH 23.7 (L) 26.0 - 34.0 pg   MCHC 33.3 30.0 - 36.0 g/dL   RDW 86.516.6 (H) 78.411.5 - 69.615.5 %   Platelets 346 150 - 400 K/uL   nRBC 0.0 0.0 - 0.2 %   Neutrophils Relative % 52 %   Neutro Abs 3.7 1.7 - 7.7 K/uL   Lymphocytes Relative 40 %   Lymphs Abs 2.9 0.7 - 4.0 K/uL   Monocytes Relative 5 %   Monocytes Absolute 0.4 0.1 - 1.0 K/uL   Eosinophils Relative 2 %   Eosinophils Absolute 0.1 0.0 - 0.5 K/uL   Basophils Relative 0 %   Basophils Absolute 0.0 0.0 - 0.1 K/uL   Immature Granulocytes 1 %   Abs Immature Granulocytes 0.04 0.00 - 0.07 K/uL  Basic metabolic panel     Status: Abnormal   Collection Time: 02/11/22  1:40 AM  Result Value Ref Range   Sodium 136 135 - 145 mmol/L   Potassium 3.3 (L) 3.5 - 5.1 mmol/L   Chloride 104 98 - 111 mmol/L   CO2 25 22 - 32 mmol/L   Glucose, Bld 146 (H) 70 - 99 mg/dL   BUN 24 (H) 6 - 20 mg/dL   Creatinine, Ser 2.951.03 0.61 - 1.24 mg/dL   Calcium 9.5 8.9 - 28.410.3 mg/dL   GFR, Estimated >13>60 >24>60 mL/min   Anion gap 7 5 - 15  Troponin I (High Sensitivity)     Status: None   Collection Time: 02/11/22  1:40 AM  Result Value Ref Range   Troponin I (High Sensitivity) 4 <18 ng/L  Troponin I (High Sensitivity)     Status: None   Collection Time: 02/11/22  3:38 AM  Result Value Ref Range   Troponin I (High Sensitivity) 4 <18 ng/L   Imaging Studies: No results found.  ED COURSE and MDM  Nursing notes, initial and subsequent vitals signs, including pulse oximetry,  reviewed and interpreted by myself.  Vitals:   02/11/22 0123 02/11/22 0125 02/11/22 0236 02/11/22 0330  BP:  103/60 114/65 114/63  Pulse:  75 69 67  Resp:  18 (!) 26 14  Temp:  98.1 F (  36.7 C)    TempSrc:  Oral    SpO2:  99% 98% 99%  Weight: 95.3 kg     Height: 5\' 8"  (1.727 m)      Medications  sodium chloride 0.9 % bolus 1,000 mL (0 mLs Intravenous Stopped 02/11/22 0304)  sodium chloride 0.9 % bolus 1,000 mL (1,000 mLs Intravenous New Bag/Given 02/11/22 0357)   1:39 AM EKG is abnormal (inferior and lateral T wave inversions) but there are no old EKGs with which to compare.  He has had EKGs done through [facility] but I was unable to access these through Epic.  3:49 AM Patient's urine specific gravity is elevated even after hydration.  This suggest dehydration as the cause of his syncopal episodes.  Sleep deprivation may also have contributed.  His first troponin is normal and a second troponin is pending.  4:08 AM Second troponin unchanged.  I doubt this event was due to cardiac ischemia.  Patient given second liter of normal saline.   PROCEDURES  Procedures   ED DIAGNOSES     ICD-10-CM   1. Syncope without other cardiovascular symptoms  R55    R09.89     2. Dehydration  E86.0          Shantay Sonn, 01-23-2000, MD 02/11/22 [id number]

## 2022-02-11 NOTE — ED Notes (Signed)
Pt alert and oriented, discharge information explained to patient and wife, pt has no questions at this time.

## 2022-02-11 NOTE — ED Triage Notes (Addendum)
Passed out about an hour ago. Had been working/cutting grass today. Denies injury or illness. Sat down before hand.

## 2024-11-28 ENCOUNTER — Ambulatory Visit: Admitting: Family Medicine
